# Patient Record
Sex: Male | Born: 2000 | Race: White | Hispanic: No | Marital: Single | Smoking: Never smoker
Health system: Southern US, Community
[De-identification: ages and names within clinical notes are randomized; demographics above are authoritative.]

## PROBLEM LIST (undated history)

## (undated) DIAGNOSIS — R197 Diarrhea, unspecified: Secondary | ICD-10-CM

## (undated) DIAGNOSIS — R109 Unspecified abdominal pain: Secondary | ICD-10-CM

## (undated) HISTORY — DX: Unspecified abdominal pain: R10.9

## (undated) HISTORY — DX: Diarrhea, unspecified: R19.7

---
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
## 2019-07-24 ENCOUNTER — Encounter (HOSPITAL_COMMUNITY): Payer: Self-pay | Admitting: Emergency Medicine

## 2019-07-24 ENCOUNTER — Other Ambulatory Visit: Payer: Self-pay

## 2019-07-24 ENCOUNTER — Emergency Department (HOSPITAL_COMMUNITY)
Admission: EM | Admit: 2019-07-24 | Discharge: 2019-07-24 | Disposition: A | Discharge status: Home / Self Care | Payer: BC Managed Care – PPO | Attending: Emergency Medicine | Admitting: Emergency Medicine

## 2019-07-24 DIAGNOSIS — Y999 Unspecified external cause status: Secondary | ICD-10-CM | POA: Insufficient documentation

## 2019-07-24 DIAGNOSIS — W458XXA Other foreign body or object entering through skin, initial encounter: Secondary | ICD-10-CM | POA: Diagnosis not present

## 2019-07-24 DIAGNOSIS — Z23 Encounter for immunization: Secondary | ICD-10-CM | POA: Insufficient documentation

## 2019-07-24 DIAGNOSIS — S60351A Superficial foreign body of right thumb, initial encounter: Secondary | ICD-10-CM | POA: Insufficient documentation

## 2019-07-24 DIAGNOSIS — Y9389 Activity, other specified: Secondary | ICD-10-CM | POA: Insufficient documentation

## 2019-07-24 DIAGNOSIS — Y929 Unspecified place or not applicable: Secondary | ICD-10-CM | POA: Diagnosis not present

## 2019-07-24 DIAGNOSIS — S6991XA Unspecified injury of right wrist, hand and finger(s), initial encounter: Secondary | ICD-10-CM

## 2019-07-24 DIAGNOSIS — Z79899 Other long term (current) drug therapy: Secondary | ICD-10-CM | POA: Diagnosis not present

## 2019-07-24 MED ORDER — CEPHALEXIN 500 MG PO CAPS: 500.0000 mg | ORAL_CAPSULE | Freq: Four times a day (QID) | ORAL | 0 refills | Status: AC

## 2019-07-24 MED ORDER — LIDOCAINE HCL (PF) 1 % IJ SOLN
30.0000 mL | Freq: Once | INTRAMUSCULAR | Status: AC
  Administered 2019-07-24: 22:00:00 30 mL
  Filled 2019-07-24: qty 30

## 2019-07-24 MED ORDER — BACITRACIN ZINC 500 UNIT/GM EX OINT
TOPICAL_OINTMENT | Freq: Once | CUTANEOUS | Status: AC
  Administered 2019-07-24: 1 via TOPICAL
  Filled 2019-07-24: qty 0.9

## 2019-07-24 MED ORDER — TETANUS-DIPHTH-ACELL PERTUSSIS 5-2.5-18.5 LF-MCG/0.5 IM SUSP
0.5000 mL | Freq: Once | INTRAMUSCULAR | Status: AC
  Administered 2019-07-24: 23:00:00 0.5 mL via INTRAMUSCULAR
  Filled 2019-07-24: qty 0.5

## 2019-07-24 NOTE — ED Triage Notes (Signed)
Patient reports fishing hook stuck in right thumb. Bleeding controlled.

## 2019-07-24 NOTE — Discharge Instructions (Signed)
You have been seen today for fish hook in your thumb. Please read and follow all provided instructions. Return to the emergency room for worsening condition or new concerning symptoms.  Watch for signs of infection including fever, pus draining, surrounding or streaking redness  1. Medications:  Prescription sent to pharmacy for Keflex.  This is an antibiotic to cover you for possible infection from the fish hook Continue usual home medications  Take medications as prescribed. Please review all of the medicines and only take them if you do not have an allergy to them.   2. Treatment: rest, drink plenty of fluids. Continue to wash wound daily with soap and water.  Wear a Band-Aid and use antibiotic ointment when using your hands to prevent infection.  3. Follow Up: Please follow up with your primary doctor in 2-5 days for discussion of your diagnoses and further evaluation after today's visit; Call today to arrange your follow up if needed   It is also a possibility that you have an allergic reaction to any of the medicines that you have been prescribed - Everybody reacts differently to medications and while MOST people have no trouble with most medicines, you may have a reaction such as nausea, vomiting, rash, swelling, shortness of breath. If this is the case, please stop taking the medicine immediately and contact your physician.  ?

## 2019-07-24 NOTE — ED Provider Notes (Signed)
[facility] DEPT Provider Note   CSN: [id number] Arrival date & time: 07/24/19  1946     History   Chief Complaint Chief Complaint  Patient presents with  . Foreign Body in Skin  . Finger Injury    HPI Willie Chapman is a 18 y.o. right hand dominant male otherwise healthy presents emergency department today with chief complaint of thumb injury. Onset was acute, happening 2 hours prior to arrival.   Patient states he was fishing and when he wheeled in the line and got the hook caught in his right thumb. He is reporting pain his thumb that he describes as constant aching. He rates pain 7/10 in severity. Pain does not radiate. He did not take anything for pain prior to arrival.  He is unsure of last tetanus immunization. He denies fever, chills, numbness, weakness.     Past Medical History:  Diagnosis Date  . Abdominal pain, recurrent   . Diarrhea     Patient Active Problem List   Diagnosis Date Noted  . Chronic diarrhea 10/07/2011    History reviewed. No pertinent surgical history.      Home Medications    Prior to Admission medications   Medication Sig Start Date End Date Taking? Authorizing Provider  amoxicillin (AMOXIL) 400 MG/5ML suspension Take 800 mg by mouth 2 (two) times daily. Started 07/06/12 for 10 days    [provider]  cephALEXin (KEFLEX) 500 MG capsule Take 1 capsule (500 mg total) by mouth 4 (four) times daily for 5 days. 07/24/19 07/29/19  Tashawna Thom E, PA-C  Fiber CHEW Chew 2 tablets by mouth daily. 09/02/11   Oletha Blend, MD  mometasone (NASONEX) 50 MCG/ACT nasal spray Place 2 sprays into the nose daily.      [provider]  montelukast (SINGULAIR) 5 MG chewable tablet Chew 5 mg by mouth at bedtime.      [provider]    Family History Family History  Problem Relation Age of Onset  . Crohn's disease Father   . Ulcers Maternal Grandmother   . Kidney disease Maternal Grandmother   .  Kidney disease Paternal Grandmother     Social History Social History   Tobacco Use  . Smoking status: Never Smoker  . Smokeless tobacco: Never Used  Substance Use Topics  . Alcohol use: Never    Frequency: Never  . Drug use: Never     Allergies   Patient has no known allergies.   Review of Systems Review of Systems  Constitutional: Negative for chills and fever.  Musculoskeletal: Positive for arthralgias.  Skin: Positive for wound.  Allergic/Immunologic: Negative for immunocompromised state.  Neurological: Negative for weakness and numbness.     Physical Exam Updated Vital Signs BP (!) 159/94 (BP Location: Left Arm)   Pulse (!) 106   Temp 99.2 F (37.3 C) (Oral)   Resp 17   Ht 6' (1.829 m)   Wt 104.6 kg   SpO2 100%   BMI 31.27 kg/m   Physical Exam Vitals signs and nursing note reviewed.  Constitutional:      Appearance: He is well-developed. He is not ill-appearing or toxic-appearing.  HENT:     Head: Normocephalic and atraumatic.     Nose: Nose normal.  Eyes:     General: No scleral icterus.       Right eye: No discharge.        Left eye: No discharge.     Conjunctiva/sclera: Conjunctivae normal.  Neck:     Musculoskeletal: Normal range of motion.     Vascular: No JVD.  Cardiovascular:     Rate and Rhythm: Regular rhythm. Tachycardia present.     Pulses: Normal pulses.          Radial pulses are 2+ on the right side and 2+ on the left side.     Heart sounds: Normal heart sounds.  Pulmonary:     Effort: Pulmonary effort is normal.     Breath sounds: Normal breath sounds.  Abdominal:     General: There is no distension.  Musculoskeletal: Normal range of motion.  Skin:    General: Skin is warm and dry.  Neurological:     Mental Status: He is oriented to person, place, and time.     GCS: GCS eye subscore is 4. GCS verbal subscore is 5. GCS motor subscore is 6.     Comments: Fluent speech, no facial droop.  Psychiatric:        Behavior:  Behavior normal.      ED Treatments / Results  Labs (all labs ordered are listed, but only abnormal results are displayed) Labs Reviewed - No data to display  EKG None  Radiology No results found.  Procedures .Foreign Body Removal  Date/Time: 07/25/2019 1:11 AM Performed by: Sherene Sires, PA-C Authorized by: Sherene Sires, PA-C  Consent: Verbal consent obtained. Risks and benefits: risks, benefits and alternatives were discussed Consent given by: patient Patient understanding: patient states understanding of the procedure being performed Patient identity confirmed: verbally with patient Intake: right thumb. Anesthesia: digital block  Anesthesia: Local Anesthetic: lidocaine 1% without epinephrine Patient cooperative: yes Complexity: simple Post-procedure assessment: foreign body removed Patient tolerance: patient tolerated the procedure well with no immediate complications   (including critical care time)  Medications Ordered in ED Medications  lidocaine (PF) (XYLOCAINE) 1 % injection 30 mL (30 mLs Infiltration Given 07/24/19 2146)  Tdap (BOOSTRIX) injection 0.5 mL (0.5 mLs Intramuscular Given 07/24/19 2239)  bacitracin ointment (1 application Topical Given 07/24/19 2240)     Initial Impression / Assessment and Plan / ED Course  I have reviewed the triage vital signs and the nursing notes.  Pertinent labs & imaging results that were available during my care of the patient were reviewed by me and considered in my medical decision making (see chart for details).  Patient seen and examined. Patient nontoxic appearing, in no apparent distress.  On arrival he is tachycardic to 106, suspect underlying anxiety.  He has a fishhook embedded in his right thumb.  Cap refill is brisk.  Patient tolerated foreign body removal as documented above.  No immediate complications.  ED attending assisted in removal.  Wound care provided and dressing applied.  Will start on  prophylactic antibiotics.  Tetanus updated at today's visit.  Tachycardia resolved at discharge.  The patient appears reasonably screened and/or stabilized for discharge and I doubt any other medical condition or other [facility] requiring further screening, evaluation, or treatment in the ED at this time prior to discharge. The patient is safe for discharge with strict return precautions discussed. Recommend pcp follow up.   Portions of this note were generated with Scientist, clinical (histocompatibility and immunogenetics). Dictation errors may occur despite best attempts at proofreading.    Final Clinical Impressions(s) / ED Diagnoses   Final diagnoses:  Fish hook injury of right thumb, initial encounter    ED Discharge Orders         Ordered  cephALEXin (KEFLEX) 500 MG capsule  4 times daily     07/24/19 2242           Kathyrn Lass 07/25/19 0114    Loren Racer, MD 07/25/19 2010

## 2024-01-26 ENCOUNTER — Encounter: Payer: Self-pay | Admitting: Nurse Practitioner

## 2024-01-26 ENCOUNTER — Ambulatory Visit (INDEPENDENT_AMBULATORY_CARE_PROVIDER_SITE_OTHER): Payer: Self-pay | Admitting: Nurse Practitioner

## 2024-01-26 ENCOUNTER — Other Ambulatory Visit (INDEPENDENT_AMBULATORY_CARE_PROVIDER_SITE_OTHER)

## 2024-01-26 VITALS — BP 130/68 | HR 68 | Ht 71.0 in | Wt 213.0 lb

## 2024-01-26 DIAGNOSIS — R109 Unspecified abdominal pain: Secondary | ICD-10-CM | POA: Diagnosis not present

## 2024-01-26 DIAGNOSIS — R197 Diarrhea, unspecified: Secondary | ICD-10-CM

## 2024-01-26 DIAGNOSIS — R1030 Lower abdominal pain, unspecified: Secondary | ICD-10-CM | POA: Insufficient documentation

## 2024-01-26 DIAGNOSIS — D582 Other hemoglobinopathies: Secondary | ICD-10-CM

## 2024-01-26 DIAGNOSIS — R140 Abdominal distension (gaseous): Secondary | ICD-10-CM

## 2024-01-26 DIAGNOSIS — Z8379 Family history of other diseases of the digestive system: Secondary | ICD-10-CM

## 2024-01-26 LAB — SEDIMENTATION RATE: Sed Rate: 1 mm/h (ref 0–15)

## 2024-01-26 LAB — COMPREHENSIVE METABOLIC PANEL WITH GFR
ALT: 20 U/L (ref 0–53)
AST: 16 U/L (ref 0–37)
Albumin: 4.8 g/dL (ref 3.5–5.2)
Alkaline Phosphatase: 101 U/L (ref 39–117)
BUN: 13 mg/dL (ref 6–23)
CO2: 30 meq/L (ref 19–32)
Calcium: 9.4 mg/dL (ref 8.4–10.5)
Chloride: 101 meq/L (ref 96–112)
Creatinine, Ser: 0.97 mg/dL (ref 0.40–1.50)
GFR: 108.84 mL/min (ref >60.00)
Glucose, Bld: 84 mg/dL (ref 70–99)
Potassium: 3.9 meq/L (ref 3.5–5.1)
Sodium: 139 meq/L (ref 135–145)
Total Bilirubin: 1.1 mg/dL (ref 0.2–1.2)
Total Protein: 7.3 g/dL (ref 6.0–8.3)

## 2024-01-26 LAB — CBC WITH DIFFERENTIAL/PLATELET
Basophils Absolute: 0.1 10*3/uL (ref 0.0–0.1)
Basophils Relative: 0.9 % (ref 0.0–3.0)
Eosinophils Absolute: 0.1 10*3/uL (ref 0.0–0.7)
Eosinophils Relative: 1.3 % (ref 0.0–5.0)
HCT: 50.1 % (ref 39.0–52.0)
Hemoglobin: 17.2 g/dL — ABNORMAL HIGH (ref 13.0–17.0)
Lymphocytes Relative: 35.7 % (ref 12.0–46.0)
Lymphs Abs: 2.2 10*3/uL (ref 0.7–4.0)
MCHC: 34.4 g/dL (ref 30.0–36.0)
MCV: 85.2 fl (ref 78.0–100.0)
Monocytes Absolute: 0.5 10*3/uL (ref 0.1–1.0)
Monocytes Relative: 8.1 % (ref 3.0–12.0)
Neutro Abs: 3.4 10*3/uL (ref 1.4–7.7)
Neutrophils Relative %: 54 % (ref 43.0–77.0)
Platelets: 219 10*3/uL (ref 150.0–400.0)
RBC: 5.88 Mil/uL — ABNORMAL HIGH (ref 4.22–5.81)
RDW: 12.9 % (ref 11.5–15.5)
WBC: 6.3 10*3/uL (ref 4.0–10.5)

## 2024-01-26 LAB — C-REACTIVE PROTEIN: CRP: <1 mg/dL (ref 0.5–20.0)

## 2024-01-26 MED ORDER — DICYCLOMINE HCL 10 MG PO CAPS: 10.0000 mg | ORAL_CAPSULE | Freq: Three times a day (TID) | ORAL | 1 refills | Status: AC | PRN

## 2024-01-26 NOTE — Patient Instructions (Addendum)
 We have sent the following medications to your pharmacy for you to pick up at your convenience: Dicyclomine 10mg  take every 8 hours as needed.  Your provider has requested that you go to the basement level for lab work before leaving today. Press "B" on the elevator. The lab is located at the first door on the left as you exit the elevator.  Due to recent changes in healthcare laws, you may see the results of your imaging and laboratory studies on MyChart before your provider has had a chance to review them.  We understand that in some cases there may be results that are confusing or concerning to you. Not all laboratory results come back in the same time frame and the provider may be waiting for multiple results in order to interpret others.  Please give [place] 48 hours in order for your provider to thoroughly review all the results before contacting the office for clarification of your results.   Please drink 8 glasses (64oz) of water daily  You have been scheduled for a CT scan of the abdomen and pelvis at [facility], 1st floor Radiology. You are scheduled on 02/08/24 at 10:45am. You should arrive 15 minutes prior to your appointment time for registration.    Please follow the written instructions below on the day of your exam:   1) Do not eat anything after 6:45am (4 hours prior to your test)   You may take any medications as prescribed with a small amount of water, if necessary. If you take any of the following medications: METFORMIN, GLUCOPHAGE, GLUCOVANCE, AVANDAMET, RIOMET, FORTAMET, ACTOPLUS MET, JANUMET, GLUMETZA or METAGLIP, you MAY be asked to HOLD this medication 48 hours AFTER the exam.   The purpose of you drinking the oral contrast is to aid in the visualization of your intestinal tract. The contrast solution may cause some diarrhea. Depending on your individual set of symptoms, you may also receive an intravenous injection of x-ray contrast/dye. Plan on being at [facility] for  45 minutes or longer, depending on the type of exam you are having performed.   If you have any questions regarding your exam or if you need to reschedule, you may call [facility] at [phone] between the hours of 8:00 am and 5:00 pm, Monday-Friday.

## 2024-01-26 NOTE — Progress Notes (Addendum)
 01/26/2024 Willie Chapman [id number] 2000-11-22   CHIEF COMPLAINT:  Abdominal pain and loose stools  HISTORY OF PRESENT ILLNESS: Willie Chapman is a 23 year old male with no significant past medical history.  Past surgical history includes eustachian tubes as a child and lipoma excision to his back.  He presents to our office today as referred by Dr. Particia Jasper for further evaluation regarding abdominal pain and loose stools.  He developed RLQ and LLQ with abdominal gas bloat and change in bowel pattern 1-1/2 months ago.  He describes having RLQ and LLQ pain which comes and goes and sometimes is sharp at night and comes in waves.  He also noticed having more prevalent LLQ pain when he runs.  He is in training and runs 5 to 7 miles 3 to 4 days weekly since summer 2024.  He previously passed a normal formed bowel movement 3 days weekly, sometimes daily with infrequent diarrhea but for the past 6 weeks is passing looser stools most days.  He sometimes passes small pieces of stool several times daily which takes a few trips to the bathroom, does not feel emptied and feels pressure discomfort in his lower abdomen.  He sometimes passes several loose stools for 3 consecutive days then the next day he passes a solid stool.  No bloody diarrhea.  He rarely sees a small amount of red blood on the toilet tissue. No mucus per the rectum.  His abdominal pain decreases after he passes gas.  He has taken Pepto-Bismol on a few occasions without significant symptom relief.  He stated eating less healthy for the past few months, eating more fast foods and fried foods.  No fevers or weight loss.  No NSAID use.  Social History: He is single.  He is a Sport and exercise psychologist.  Non-smoker.  Rare alcohol intake.  No drug use.  Alcohol intake a few times yearly. Paternal grandmother IBS.   Family History: Father with history of Crohn's disease, required surgery.  No known family history of celiac disease or colorectal  cancer.  No Known Allergies  Medications: None   REVIEW OF SYSTEMS:  Gen: Denies fever, sweats or chills. No weight loss.  CV: Denies chest pain, palpitations or edema. Resp: Denies cough, shortness of breath of hemoptysis.  GI:See HPI. No GERD symptoms.  GU: Denies urinary burning, blood in urine, increased urinary frequency or incontinence. MS: Denies joint pain, muscles aches or weakness. Derm: Denies rash, itchiness, skin lesions or unhealing ulcers. Psych: Denies depression, anxiety, memory loss or confusion. Heme: Denies bruising, easy bleeding. Neuro:  Denies headaches, dizziness or paresthesias. Endo:  Denies any problems with DM, thyroid or adrenal function.  PHYSICAL EXAM: BP 130/68   Pulse 68   Ht 5\' 11"  (1.803 m)   Wt 213 lb (96.6 kg)   BMI 29.71 kg/m   General: 23 year old male in no acute distress. Head: Normocephalic and atraumatic. Eyes:  Sclerae non-icteric, conjunctive pink. Ears: Normal auditory acuity. Mouth: Dentition intact. No ulcers or lesions.  Neck: Supple, no lymphadenopathy or thyromegaly.  Lungs: Clear bilaterally to auscultation without wheezes, crackles or rhonchi. Heart: Regular rate and rhythm. No murmur, rub or gallop appreciated.  Abdomen: Soft, nondistended.  Mild tenderness to the central lower abdomen and LLQ without rebound or guarding.  No masses. No hepatosplenomegaly. Normoactive bowel sounds x 4 quadrants.  Rectal: Deferred.  Musculoskeletal: Symmetrical with no gross deformities. Skin: Warm and dry. No rash or lesions on visible extremities.  Extremities: No edema. Neurological: Alert oriented x 4, no focal deficits.  Psychological:  Alert and cooperative. Normal mood and affect.  ASSESSMENT AND PLAN:  23 year old male with a change in bowel pattern with associated abdominal bloat/gas and lower abdominal pain x 1-1/2 months. Father with history of Crohn's disease. -CBC, CMP, CRP, sed rate, TTG and IgA level -CTAP with  contrast -I discussed scheduling a future diagnostic colonoscopy if the above evaluation unrevealing and is lower abdominal pain and loose stools persist -Dicyclomine 10 mg tab 1 p.o. every 8 hours as needed for abdominal pain -Drink 64 ounces of water daily -Avoid eating fast foods/greasy foods -Follow-up with Dr. Tomasa Rand in 6 weeks    CC:  Particia Jasper, MD

## 2024-01-30 LAB — IGA: Immunoglobulin A: 194 mg/dL (ref 47–310)

## 2024-01-30 LAB — TISSUE TRANSGLUTAMINASE ABS,IGG,IGA
(tTG) Ab, IgA: <1 U/mL
(tTG) Ab, IgG: <1 U/mL

## 2024-01-30 NOTE — Addendum Note (Signed)
 Addended by: Rise Paganini on: 01/30/2024 09:32 AM   Modules accepted: Orders

## 2024-02-05 NOTE — Progress Notes (Signed)
 Agree with the assessment and plan as outlined by Alcide Evener, NP.    Willie Kirtz E. Tomasa Rand, MD [facility]

## 2024-02-08 ENCOUNTER — Ambulatory Visit (HOSPITAL_COMMUNITY)
Admission: RE | Admit: 2024-02-08 | Discharge: 2024-02-08 | Disposition: A | Discharge status: Home / Self Care | Source: Ambulatory Visit | Attending: Nurse Practitioner | Admitting: Nurse Practitioner

## 2024-02-08 DIAGNOSIS — R109 Unspecified abdominal pain: Secondary | ICD-10-CM | POA: Diagnosis present

## 2024-02-08 DIAGNOSIS — R197 Diarrhea, unspecified: Secondary | ICD-10-CM | POA: Diagnosis present

## 2024-02-08 MED ORDER — SODIUM CHLORIDE (PF) 0.9 % IJ SOLN
INTRAMUSCULAR | Status: AC
  Filled 2024-02-08: qty 50

## 2024-02-08 MED ORDER — IOHEXOL 9 MG/ML PO SOLN
ORAL | Status: AC
  Filled 2024-02-08: qty 1000

## 2024-02-08 MED ORDER — IOHEXOL 300 MG/ML  SOLN
100.0000 mL | Freq: Once | INTRAMUSCULAR | Status: AC | PRN
  Administered 2024-02-08: 100 mL via INTRAVENOUS

## 2024-02-08 MED ORDER — IOHEXOL 9 MG/ML PO SOLN
1000.0000 mL | ORAL | Status: AC
  Administered 2024-02-08: 1000 mL via ORAL

## 2024-02-23 ENCOUNTER — Encounter: Payer: Self-pay | Admitting: *Deleted

## 2024-02-23 ENCOUNTER — Other Ambulatory Visit: Payer: Self-pay | Admitting: *Deleted

## 2024-02-23 DIAGNOSIS — Z8379 Family history of other diseases of the digestive system: Secondary | ICD-10-CM

## 2024-02-23 DIAGNOSIS — R197 Diarrhea, unspecified: Secondary | ICD-10-CM

## 2024-02-23 DIAGNOSIS — R109 Unspecified abdominal pain: Secondary | ICD-10-CM

## 2024-02-23 DIAGNOSIS — R935 Abnormal findings on diagnostic imaging of other abdominal regions, including retroperitoneum: Secondary | ICD-10-CM

## 2024-02-23 MED ORDER — NA SULFATE-K SULFATE-MG SULF 17.5-3.13-1.6 GM/177ML PO SOLN: ORAL | 0 refills | Status: DC

## 2024-03-07 ENCOUNTER — Encounter: Payer: Self-pay | Admitting: Gastroenterology

## 2024-03-07 ENCOUNTER — Ambulatory Visit: Admitting: Gastroenterology

## 2024-03-07 VITALS — BP 96/46 | HR 58 | Temp 98.4°F | Resp 12 | Ht 71.0 in | Wt 213.0 lb

## 2024-03-07 DIAGNOSIS — D582 Other hemoglobinopathies: Secondary | ICD-10-CM

## 2024-03-07 DIAGNOSIS — R933 Abnormal findings on diagnostic imaging of other parts of digestive tract: Secondary | ICD-10-CM

## 2024-03-07 DIAGNOSIS — D123 Benign neoplasm of transverse colon: Secondary | ICD-10-CM

## 2024-03-07 DIAGNOSIS — R197 Diarrhea, unspecified: Secondary | ICD-10-CM

## 2024-03-07 DIAGNOSIS — R109 Unspecified abdominal pain: Secondary | ICD-10-CM

## 2024-03-07 MED ORDER — SODIUM CHLORIDE 0.9 % IV SOLN: 500.0000 mL | INTRAVENOUS | Status: DC

## 2024-03-07 NOTE — Progress Notes (Unsigned)
 Pt's states no medical or surgical changes since previsit or office visit.

## 2024-03-07 NOTE — Progress Notes (Signed)
 Called to room to assist during endoscopic procedure.  Patient ID and intended procedure confirmed with present staff. Received instructions for my participation in the procedure from the performing physician.

## 2024-03-07 NOTE — Patient Instructions (Addendum)
 Resume previous diet Continue present medications Await pathology results  See handout for polyps Repeat colonoscopy date determined by pathology results YOU HAD AN ENDOSCOPIC PROCEDURE TODAY AT THE [facility]:   Refer to the procedure report that was given to you for any specific questions about what was found during the examination.  If the procedure report does not answer your questions, please call your gastroenterologist to clarify.  If you requested that your care partner not be given the details of your procedure findings, then the procedure report has been included in a sealed envelope for you to review at your convenience later.  YOU SHOULD EXPECT: Some feelings of bloating in the abdomen. Passage of more gas than usual.  Walking can help get rid of the air that was put into your GI tract during the procedure and reduce the bloating. If you had a lower endoscopy (such as a colonoscopy or flexible sigmoidoscopy) you may notice spotting of blood in your stool or on the toilet paper. If you underwent a bowel prep for your procedure, you may not have a normal bowel movement for a few days.  Please Note:  You might notice some irritation and congestion in your nose or some drainage.  This is from the oxygen used during your procedure.  There is no need for concern and it should clear up in a day or so.  SYMPTOMS TO REPORT IMMEDIATELY:  Following lower endoscopy (colonoscopy or flexible sigmoidoscopy):  Excessive amounts of blood in the stool  Significant tenderness or worsening of abdominal pains  Swelling of the abdomen that is new, acute  Fever of 100F or higher For urgent or emergent issues, a gastroenterologist can be reached at any hour by calling [phone]. Do not use MyChart messaging for urgent concerns.   DIET:  We do recommend a small meal at first, but then you may proceed to your regular diet.  Drink plenty of fluids but you should avoid alcoholic beverages  for 24 hours.  ACTIVITY:  You should plan to take it easy for the rest of today and you should NOT DRIVE or use heavy machinery until tomorrow (because of the sedation medicines used during the test).    FOLLOW UP: Our staff will call the number listed on your records the next business day following your procedure.  We will call around 7:15- 8:00 am to check on you and address any questions or concerns that you may have regarding the information given to you following your procedure. If we do not reach you, we will leave a message.     If any biopsies were taken you will be contacted by phone or by letter within the next 1-3 weeks.  Please call us  at [phone] if you have not heard about the biopsies in 3 weeks.   SIGNATURES/CONFIDENTIALITY: You and/or your care partner have signed paperwork which will be entered into your electronic medical record.  These signatures attest to the fact that that the information above on your After Visit Summary has been reviewed and is understood.  Full responsibility of the confidentiality of this discharge information lies with you and/or your care-partner.

## 2024-03-07 NOTE — Progress Notes (Unsigned)
 [facility] History and Physical   Primary Care Physician:  Bertha Broad, MD   Reason for Procedure:   Abdominal pain, abnormal CT  Plan:    Colonoscopy     HPI: Willie Chapman is a 23 y.o. male undergoing colonoscopy to evaluate chronic abdominal pain/bloating and CT with findings suggestive of ileitis.  His father has Crohn's disease.  He has no family history of colon cancer.   Past Medical History:  Diagnosis Date   Abdominal pain, recurrent    Diarrhea     History reviewed. No pertinent surgical history.  Prior to Admission medications   Medication Sig Start Date End Date Taking? Authorizing Provider  dicyclomine  (BENTYL ) 10 MG capsule Take 1 capsule (10 mg total) by mouth every 8 (eight) hours as needed for spasms. 01/26/24   Kennedy-Smith, Colleen M, NP    Current Outpatient Medications  Medication Sig Dispense Refill   dicyclomine  (BENTYL ) 10 MG capsule Take 1 capsule (10 mg total) by mouth every 8 (eight) hours as needed for spasms. 30 capsule 1   Current Facility-Administered Medications  Medication Dose Route Frequency Provider Last Rate Last Admin   0.9 %  sodium chloride  infusion  500 mL Intravenous Continuous Elois Hair, MD        Allergies as of 03/07/2024   (No Known Allergies)    Family History  Problem Relation Age of Onset   Crohn's disease Father    Ulcers Maternal Grandmother    Kidney disease Maternal Grandmother    Diabetes Maternal Grandfather    Heart disease Maternal Grandfather    Irritable bowel syndrome Maternal Grandfather    Kidney disease Paternal Grandmother    Liver cancer Neg Hx    Esophageal cancer Neg Hx    Colon cancer Neg Hx     Social History   Socioeconomic History   Marital status: Single    Spouse name: Not on file   Number of children: 0   Years of education: Not on file   Highest education level: Not on file  Occupational History   Occupation: Programmer, applications  Tobacco Use    Smoking status: Never   Smokeless tobacco: Never  Vaping Use   Vaping status: Never Used  Substance and Sexual Activity   Alcohol use: Not Currently    Comment: occ   Drug use: Never   Sexual activity: Yes  Other Topics Concern   Not on file  Social History Narrative   7th grade; marching band   Social Drivers of Corporate investment banker Strain: Not on file  Food Insecurity: Not on file  Transportation Needs: Not on file  Physical Activity: Not on file  Stress: Not on file  Social Connections: Unknown (03/15/2022)   Received from [facility]   Social Network    Social Network: Not on file  Intimate Partner Violence: Unknown (02/04/2022)   Received from [facility]   HITS    Physically Hurt: Not on file    Insult or Talk Down To: Not on file    Threaten Physical Harm: Not on file    Scream or Curse: Not on file    Review of Systems:  All other review of systems negative except as mentioned in the HPI.  Physical Exam: Vital signs BP 119/64   Pulse 64   Temp 98.4 F (36.9 C)   Resp 14   Ht 5\' 11"  (1.803 m)   Wt 213 lb (96.6 kg)   SpO2  99%   BMI 29.71 kg/m   General:   Alert,  Well-developed, well-nourished, pleasant and cooperative in NAD Airway:  Mallampati 2 Lungs:  Clear throughout to auscultation.   Heart:  Regular rate and rhythm; no murmurs, clicks, rubs,  or gallops. Abdomen:  Soft, nontender and nondistended. Normal bowel sounds.   Neuro/Psych:  Normal mood and affect. A and O x 3   Bess Saltzman E. Cherryl Corona, MD [facility]

## 2024-03-07 NOTE — Progress Notes (Unsigned)
 Report to PACU, RN, vss, BBS= Clear.

## 2024-03-07 NOTE — Op Note (Signed)
 [facility] Patient Name: Willie Chapman Procedure Date: 03/07/2024 2:05 PM MRN: [id number] Endoscopist: Geralyn Knee E. Cherryl Corona , MD, [id number] Age: 23 Referring MD:  Date of Birth: 2001-09-18 Gender: Male Account #: [id number] Procedure:                Colonoscopy Indications:              Generalized abdominal pain, Exclusion of Crohn's                            disease of the colon, Abnormal CT of the GI tract Medicines:                Monitored Anesthesia Care Procedure:                Pre-Anesthesia Assessment:                           - Prior to the procedure, a History and Physical                            was performed, and patient medications and                            allergies were reviewed. The patient's tolerance of                            previous anesthesia was also reviewed. The risks                            and benefits of the procedure and the sedation                            options and risks were discussed with the patient.                            All questions were answered, and informed consent                            was obtained. Prior Anticoagulants: The patient has                            taken no anticoagulant or antiplatelet agents. ASA                            Grade Assessment: I - A normal, healthy patient.                            After reviewing the risks and benefits, the patient                            was deemed in satisfactory condition to undergo the                            procedure.  After obtaining informed consent, the colonoscope                            was passed under direct vision. Throughout the                            procedure, the patient's blood pressure, pulse, and                            oxygen saturations were monitored continuously. The                            CF [id number] #[id number] was introduced through the anus                            and advanced to the  the terminal ileum, with                            identification of the appendiceal orifice and IC                            valve. The colonoscopy was performed without                            difficulty. The patient tolerated the procedure                            well. The quality of the bowel preparation was                            good. The terminal ileum, ileocecal valve,                            appendiceal orifice, and rectum were photographed.                            The bowel preparation used was SUPREP via split                            dose instruction. Scope In: 2:16:53 PM Scope Out: 2:27:56 PM Scope Withdrawal Time: 0 hours 8 minutes 46 seconds  Total Procedure Duration: 0 hours 11 minutes 3 seconds  Findings:                 The perianal and digital rectal examinations were                            normal. Pertinent negatives include normal                            sphincter tone and no palpable rectal lesions.                           A 4 mm polyp was found in the transverse colon. The  polyp was sessile. The polyp was removed with a                            cold snare. Resection and retrieval were complete.                            Estimated blood loss was minimal.                           The exam was otherwise normal throughout the                            examined colon.                           The terminal ileum appeared normal.                           The retroflexed view of the distal rectum and anal                            verge was normal and showed no anal or rectal                            abnormalities. Complications:            No immediate complications. Estimated Blood Loss:     Estimated blood loss was minimal. Impression:               - One 4 mm polyp in the transverse colon, removed                            with a cold snare. Resected and retrieved.                           - The  examined portion of the ileum was normal.                           - The distal rectum and anal verge are normal on                            retroflexion view.                           - No evidence of Crohn's disease. Suspect ileal                            thickening on CT likely secondary to peristalsis. Recommendation:           - Patient has a contact number available for                            emergencies. The signs and symptoms of potential  delayed complications were discussed with the                            patient. Return to normal activities tomorrow.                            Written discharge instructions were provided to the                            patient.                           - Resume previous diet.                           - Continue present medications.                           - Await pathology results.                           - Repeat colonoscopy (date not yet determined) for                            surveillance based on pathology results. [name], MD 03/07/2024 2:38:20 PM This report has been signed electronically.

## 2024-03-08 ENCOUNTER — Telehealth: Payer: Self-pay | Admitting: *Deleted

## 2024-03-08 NOTE — Telephone Encounter (Signed)
  Follow up Call-     03/07/2024    1:08 PM  Call back number  Post procedure Call Back phone  # [phone]  Permission to leave phone message Yes     Patient questions:  Do you have a fever, pain , or abdominal swelling? No. Pain Score  0 *  Have you tolerated food without any problems? Yes.    Have you been able to return to your normal activities? Yes.    Do you have any questions about your discharge instructions: Diet   No. Medications  No. Follow up visit  No.  Do you have questions or concerns about your Care? No.  Actions: * If pain score is 4 or above: No action needed, pain <4.

## 2024-03-12 LAB — SURGICAL PATHOLOGY

## 2024-03-13 ENCOUNTER — Ambulatory Visit: Payer: Self-pay | Admitting: Gastroenterology

## 2024-03-13 NOTE — Progress Notes (Signed)
 Willie Chapman,  The polyp which I removed during your recent procedure was proven to be completely benign but is considered a "pre-cancerous" polyp that MAY have grown into cancer if it had not been removed.  Studies shows that at least 20% of women over age 25 and 30% of men over age 29 have pre-cancerous polyps.  Although younger patients can have precancerous polyps, it is much rarer.  Given the presence of a precancerous polyp at a young age, I would recommend closer surveillance than if you were older. I recommend we repeat a colonoscopy in 5 years.  If you develop any new rectal bleeding, abdominal pain or significant bowel habit changes, please contact me before then.

## 2024-04-12 ENCOUNTER — Ambulatory Visit (INDEPENDENT_AMBULATORY_CARE_PROVIDER_SITE_OTHER): Admitting: Gastroenterology

## 2024-04-12 ENCOUNTER — Encounter: Payer: Self-pay | Admitting: Gastroenterology

## 2024-04-12 VITALS — BP 120/72 | HR 98 | Ht 71.0 in | Wt 209.0 lb

## 2024-04-12 DIAGNOSIS — R109 Unspecified abdominal pain: Secondary | ICD-10-CM

## 2024-04-12 DIAGNOSIS — R197 Diarrhea, unspecified: Secondary | ICD-10-CM

## 2024-04-12 DIAGNOSIS — Z860101 Personal history of adenomatous and serrated colon polyps: Secondary | ICD-10-CM

## 2024-04-12 DIAGNOSIS — K580 Irritable bowel syndrome with diarrhea: Secondary | ICD-10-CM

## 2024-04-12 DIAGNOSIS — Z860100 Personal history of colon polyps, unspecified: Secondary | ICD-10-CM

## 2024-04-12 NOTE — Progress Notes (Signed)
 Discussed the use of AI scribe software for clinical note transcription with the patient, who gave verbal consent to proceed.  HPI : Willie Chapman is a 23 year old male who presents for follow up of abdominal pain and diarrhea.  He was initially seen in our office by Everett Hitt on January 26, 2024.  CRP and TTG were normal.  A CT showed suspected ileal thickening.  His father has Crohn's disease.    He underwent a colonoscopy which showed a normal terminal ileum and normal colonic mucosa.  A 4mm tubular adenoma was removed from the transverse colon.  His abdominal pain that has improved significantly over the past few months due to increased physical activity and dietary changes, specifically reducing processed foods and fast food. However, he still experiences intermittent diarrhea, occurring two to three times a week, typically in the morning with two or three episodes or loose or semiformed stools.  The next day his stool will be normal and formed.  Red sauce seems to trigger these episodes, although he continues to consume it occasionally.  On non-diarrhea days, his stools are normal in form. No blood in the stool. Abdominal pain is less frequent and not as severe as before. He occasionally experiences a bloating sensation if he eats within a few hours of bedtime, but this has also decreased.  He is not currently taking any medications, including dicyclomine , which he previously used. He has made dietary changes to include more fruits and vegetables, such as apples and bananas, in an effort to eat healthier.  The previous CT scan also mentioned a follow-up recommendation for the adrenal glands, but he is unsure of the significance as the report indicated normal size adrenal glands.    Colonoscopy Mar 07, 2024 Indication:  Ileal thickening on CT/abdominal pain Normal terminal ileum Normal colonic mucosa 4 mm tubular adenoma in transverse colon    CT Abdomen/Pelvis February 08, 2024  FINDINGS: Lower chest: No infiltrates or consolidations, no pleural effusions   Hepatobiliary: Liver normal size no masses no biliary dilatation. Gallbladder unremarkable. No gallstones.   Pancreas: Pancreas normal size. No masses calcifications or inflammatory changes.   Spleen: Spleen normal size.  No masses.   Adrenals/Urinary Tract: Adrenal glands are normal size. Follow-up recommended. Kidneys are normal. No masses calcifications or hydronephrosis   Stomach/Bowel: No obstruction. No inflammatory changes. There is some degree of mucosal thickening involving the distal terminal ileum, findings that in the right clinical setting could correlate with ileitis, inflammatory bowel disease and/or enteritis.   Vascular/Lymphatic: No significant vascular findings are present. No enlarged abdominal or pelvic lymph nodes.   Reproductive: .  No masses.   Bladder unremarkable.   Other: Anterior abdominal wall unremarkable without evidence of umbilical or inguinal hernias   Musculoskeletal: Visualized portion of the thoracolumbar spine and pelvic structures grossly unremarkable without evidence of fracture bony abnormalities or soft tissue masses.   IMPRESSION: There is some degree of mucosal thickening involving the distal terminal ileum, findings that in the right clinical setting could correlate with ileitis, inflammatory bowel disease and/or enteritis.    Past Medical History:  Diagnosis Date   Abdominal pain, recurrent    Diarrhea      History reviewed. No pertinent surgical history. Family History  Problem Relation Age of Onset   Crohn's disease Father    Ulcers Maternal Grandmother    Kidney disease Maternal Grandmother    Diabetes Maternal Grandfather    Heart disease Maternal Grandfather  Irritable bowel syndrome Maternal Grandfather    Kidney disease Paternal Grandmother    Liver cancer Neg Hx    Esophageal cancer Neg Hx    Colon cancer Neg Hx     Social History   Tobacco Use   Smoking status: Never   Smokeless tobacco: Never  Vaping Use   Vaping status: Never Used  Substance Use Topics   Alcohol use: Not Currently    Comment: occ   Drug use: Never   Current Outpatient Medications  Medication Sig Dispense Refill   dicyclomine  (BENTYL ) 10 MG capsule Take 1 capsule (10 mg total) by mouth every 8 (eight) hours as needed for spasms. 30 capsule 1   No current facility-administered medications for this visit.   No Known Allergies   Review of Systems: All systems reviewed and negative except where noted in HPI.    No results found.  Physical Exam: BP 120/72   Pulse 98   Ht 5' 11 (1.803 m)   Wt 209 lb (94.8 kg)   BMI 29.15 kg/m  Constitutional: Pleasant,well-developed, Caucasian male in no acute distress. HEENT: Normocephalic and atraumatic. Conjunctivae are normal. No scleral icterus. Neurological: Alert and oriented to person place and time. Skin: Skin is warm and dry. No rashes noted. Psychiatric: Normal mood and affect. Behavior is normal.  CBC    Component Value Date/Time   WBC 6.3 01/26/2024 1421   RBC 5.88 (H) 01/26/2024 1421   HGB 17.2 (H) 01/26/2024 1421   HCT 50.1 01/26/2024 1421   PLT 219.0 01/26/2024 1421   MCV 85.2 01/26/2024 1421   MCH 27.1 09/02/2011 1136   MCHC 34.4 01/26/2024 1421   RDW 12.9 01/26/2024 1421   LYMPHSABS 2.2 01/26/2024 1421   MONOABS 0.5 01/26/2024 1421   EOSABS 0.1 01/26/2024 1421   BASOSABS 0.1 01/26/2024 1421    CMP     Component Value Date/Time   NA 139 01/26/2024 1421   K 3.9 01/26/2024 1421   CL 101 01/26/2024 1421   CO2 30 01/26/2024 1421   GLUCOSE 84 01/26/2024 1421   BUN 13 01/26/2024 1421   CREATININE 0.97 01/26/2024 1421   CALCIUM 9.4 01/26/2024 1421   PROT 7.3 01/26/2024 1421   ALBUMIN 4.8 01/26/2024 1421   AST 16 01/26/2024 1421   ALT 20 01/26/2024 1421   ALKPHOS 101 01/26/2024 1421   BILITOT 1.1 01/26/2024 1421       Latest Ref Rng &  Units 01/26/2024    2:21 PM 09/02/2011   11:36 AM  CBC EXTENDED  WBC 4.0 - 10.5 K/uL 6.3  9.0   RBC 4.22 - 5.81 Mil/uL 5.88  5.20   Hemoglobin 13.0 - 17.0 g/dL 16.1  09.6   HCT 04.5 - 52.0 % 50.1  42.4   Platelets 150.0 - 400.0 K/uL 219.0  311   NEUT# 1.4 - 7.7 K/uL 3.4  4.5   Lymph# 0.7 - 4.0 K/uL 2.2  3.5       ASSESSMENT AND PLAN:  23 year old male with chronic intermittent abdominal pain and diarrhea, most consistent with IBS-D.  CT with ileal thickening, but subsequent colonoscopy showed normal ileum.  Diarrhea Intermittent diarrhea likely due to dietary triggers. Celiac disease, H. pylori, and Crohn's disease ruled out. Symptoms improved with dietary changes. - Maintain current dietary changes, reduce processed and fast foods. - Consider food diary to identify triggers. - If symptoms worsen, consider low FODMAP diet.  Abdominal pain Pain improved with dietary changes. Occasional bloating when eating  late. - Continue dietary modifications, reduce processed and fast foods.  Tubular adenoma Previous adenoma removed. Follow-up colonoscopy in 5 years due to young age. - Plan for surveillance colonoscopy in 5 years.  CT findings - CT report mentions 'Recommend follow up' after mentioning normal adrenal glands.  Suspect dictation error.   - Will clarify with radiologist to ensure there are no findings that warrant repeat CT  Recording duration: 8 minutes     Kailynn Satterly E. Cherryl Corona, MD [facility]    Bertha Broad, MD

## 2024-04-12 NOTE — Patient Instructions (Signed)
 _______________________________________________________  If your blood pressure at your visit was 140/90 or greater, please contact your primary care physician to follow up on this.  _______________________________________________________  If you are age 25 or older, your body mass index should be between 23-30. Your Body mass index is 29.15 kg/m. If this is out of the aforementioned range listed, please consider follow up with your Primary Care Provider.  If you are age 71 or younger, your body mass index should be between 19-25. Your Body mass index is 29.15 kg/m. If this is out of the aformentioned range listed, please consider follow up with your Primary Care Provider.   ________________________________________________________  The [facility] providers would like to encourage you to use MYCHART to communicate with providers for non-urgent requests or questions.  Due to long hold times on the telephone, sending your provider a message by [facility] may be a faster and more efficient way to get a response.  Please allow 48 business hours for a response.  Please remember that this is for non-urgent requests.  _______________________________________________________    It was a pleasure to see you today!  Thank you for trusting me with your gastrointestinal care!    Scott E.Cherryl Corona, MD
